# Patient Record
Sex: Male | Born: 1960 | Race: Black or African American | Hispanic: No | Marital: Married | Smoking: Current some day smoker
Health system: Southern US, Community
[De-identification: ages and names within clinical notes are randomized; demographics above are authoritative.]

## PROBLEM LIST (undated history)

## (undated) DIAGNOSIS — G43909 Migraine, unspecified, not intractable, without status migrainosus: Secondary | ICD-10-CM

---

## 2018-04-23 ENCOUNTER — Emergency Department (INDEPENDENT_AMBULATORY_CARE_PROVIDER_SITE_OTHER)
Admission: EM | Admit: 2018-04-23 | Discharge: 2018-04-23 | Disposition: A | Discharge status: Home / Self Care | Payer: No Typology Code available for payment source | Source: Home / Self Care | Attending: Family Medicine | Admitting: Family Medicine

## 2018-04-23 ENCOUNTER — Encounter: Payer: Self-pay | Admitting: *Deleted

## 2018-04-23 ENCOUNTER — Emergency Department (INDEPENDENT_AMBULATORY_CARE_PROVIDER_SITE_OTHER): Payer: No Typology Code available for payment source

## 2018-04-23 ENCOUNTER — Other Ambulatory Visit: Payer: Self-pay

## 2018-04-23 DIAGNOSIS — M7711 Lateral epicondylitis, right elbow: Secondary | ICD-10-CM

## 2018-04-23 DIAGNOSIS — R079 Chest pain, unspecified: Secondary | ICD-10-CM

## 2018-04-23 DIAGNOSIS — R05 Cough: Secondary | ICD-10-CM | POA: Diagnosis not present

## 2018-04-23 DIAGNOSIS — J209 Acute bronchitis, unspecified: Secondary | ICD-10-CM

## 2018-04-23 HISTORY — DX: Migraine, unspecified, not intractable, without status migrainosus: G43.909

## 2018-04-23 MED ORDER — BENZONATATE 200 MG PO CAPS: ORAL_CAPSULE | ORAL | 0 refills | Status: AC

## 2018-04-23 MED ORDER — DOXYCYCLINE HYCLATE 100 MG PO CAPS: 100.0000 mg | ORAL_CAPSULE | Freq: Two times a day (BID) | ORAL | 0 refills | Status: AC

## 2018-04-23 MED ORDER — PREDNISONE 20 MG PO TABS: ORAL_TABLET | ORAL | 0 refills | Status: AC

## 2018-04-23 NOTE — ED Provider Notes (Signed)
[facility]    CSN: [id number] Arrival date & time: 04/23/18  1917     History   Chief Complaint Chief Complaint  Patient presents with  . Elbow Pain  . Cough    HPI Robert Melendez is a 58 y.o. male.   Patient presents with two complaints: 1)  He has had pain in his right lateral elbow for two weeks, worse with flexion and extension of his right wrist.  He recalls no injury.  His job involves repetitive movement. 2)  He complains of one week history of non-productive cough with initial left posterior pleuritic pain (now resolved).  He has had mild nasal congestion but no sore throat.  He denies shortness of breath and wheezing.  He has developed chills during the past two days.  His cough is worse at night. He smokes several cigarettes daily.  The history is provided by the patient and the spouse.    Past Medical History:  Diagnosis Date  . Migraine     There are no active problems to display for this patient.   History reviewed. No pertinent surgical history.     Home Medications    Prior to Admission medications   Medication Sig Start Date End Date Taking? Authorizing Provider  citalopram (CELEXA) 20 MG tablet Take 20 mg by mouth daily.   Yes [provider]  TOPIRAMATE PO Take by mouth.   Yes [provider]  benzonatate (TESSALON) 200 MG capsule Take one cap by mouth at bedtime as needed for cough.  May repeat in 4 to 6 hours 04/23/18   Lattie Haw, MD  doxycycline (VIBRAMYCIN) 100 MG capsule Take 1 capsule (100 mg total) by mouth 2 (two) times daily. Take with food. 04/23/18   Lattie Haw, MD  predniSONE (DELTASONE) 20 MG tablet Take one tab by mouth twice daily for 5 days, then one daily for 3 days. Take with food. 04/23/18   Lattie Haw, MD    Family History Family History  Problem Relation Age of Onset  . Hypertension Mother     Social History Social History   Tobacco Use  . Smoking status: Current Some Day  Smoker  . Smokeless tobacco: Never Used  Substance Use Topics  . Alcohol use: Yes  . Drug use: Never     Allergies   Penicillins   Review of Systems Review of Systems No sore throat + cough + pleuritic pain left scapular area, now resolved. No wheezing + nasal congestion No post-nasal drainage No sinus pain/pressure No itchy/red eyes No earache No hemoptysis No SOB No fever, + chills/sweats No nausea No vomiting No abdominal pain No diarrhea No urinary symptoms No skin rash + fatigue No myalgias No headache + right lateral elbow pain Used OTC meds without relief   Physical Exam Triage Vital Signs ED Triage Vitals [04/23/18 1937]  Enc Vitals Group     BP 135/84     Pulse Rate 81     Resp 16     Temp      Temp src      SpO2 97 %     Weight 217 lb (98.4 kg)     Height 6\' 1"  (1.854 m)     Head Circumference      Peak Flow      Pain Score 5     Pain Loc      Pain Edu?      Excl. in GC?  No data found.  Updated Vital Signs BP 135/84 (BP Location: Left Arm)   Pulse 81   Resp 16   Ht 6\' 1"  (1.854 m)   Wt 98.4 kg   SpO2 97%   BMI 28.63 kg/m   Visual Acuity Right Eye Distance:   Left Eye Distance:   Bilateral Distance:    Right Eye Near:   Left Eye Near:    Bilateral Near:     Physical Exam Musculoskeletal:     Right elbow: He exhibits decreased range of motion. He exhibits no swelling, no effusion, no deformity and no laceration. Tenderness found. Lateral epicondyle tenderness noted.       Arms:     Comments: There is distinct tenderness over the right lateral epicondyle.  Palpation there during resisted dorsiflexion and supination of the wrist recreates his pain.     Nursing notes and Vital Signs reviewed. Appearance:  Patient appears stated age, and in no acute distress Eyes:  Pupils are equal, round, and reactive to light and accomodation.  Extraocular movement is intact.  Conjunctivae are not inflamed  Ears:  Canals normal.   Tympanic membranes normal.  Nose:  Mildly congested turbinates.  No sinus tenderness.  Pharynx:  Normal Neck:  Supple. No adenopathy   Lungs:  Clear to auscultation.  Breath sounds are equal.  Moving air well. Heart:  Regular rate and rhythm without murmurs, rubs, or gallops.  Abdomen:  Nontender without masses or hepatosplenomegaly.  Bowel sounds are present.  No CVA or flank tenderness.  Extremities:  No edema.  See elbow exam above. Skin:  No rash present.    UC Treatments / Results  Labs (all labs ordered are listed, but only abnormal results are displayed) Labs Reviewed - No data to display  EKG None  Radiology Dg Chest 2 View  Result Date: 04/23/2018 CLINICAL DATA:  Cough and left-sided chest pain for 1 week. Some day smoker. EXAM: CHEST - 2 VIEW COMPARISON:  None. FINDINGS: The cardiomediastinal contours are normal. The lungs are clear. Pulmonary vasculature is normal. No consolidation, pleural effusion, or pneumothorax. No acute osseous abnormalities are seen. Degenerative change in the spine. IMPRESSION: No acute chest findings. Electronically Signed   By: Narda Rutherford M.D.   On: 04/23/2018 20:16    Procedures Procedures (including critical care time)  Medications Ordered in UC Medications - No data to display  Initial Impression / Assessment and Plan / UC Course  I have reviewed the triage vital signs and the nursing notes.  Pertinent labs & imaging results that were available during my care of the patient were reviewed by me and considered in my medical decision making (see chart for details).    Tennis elbow brace applied to right forearm below elbow.  Patient has been advised in the past to minimize NSAID's; will begin prednisone burst/taper. Followup with Dr. Rodney Langton or Dr. Clementeen [facility] ([facility]) if not improving about three weeks.  Begin doxycycline.  Prescription written for Benzonatate [facility]) to take at bedtime for night-time  cough.  Followup with Family Doctor if cough and congestion not improved one week.    Final Clinical Impressions(s) / UC Diagnoses   Final diagnoses:  Lateral epicondylitis of right elbow  Acute bronchitis, unspecified organism     Discharge Instructions     Apply ice pack to right elbow for 20 to 30 minutes, 3 to 4 times daily  Continue until pain and swelling decrease. Wear elbow brace during the day.  Begin range of motion and stretching exercises as tolerated.  Take plain guaifenesin (1200mg  extended release tabs such as Mucinex) twice daily, with plenty of water, for cough and congestion.  Get adequate rest.   May take Delsym Cough Suppressant with Tessalon (benzonatate), if needed, at bedtime for nighttime cough.  Stop all antihistamines for now, and other non-prescription cough/cold preparations.      ED Prescriptions    Medication Sig Dispense Auth. Provider   predniSONE (DELTASONE) 20 MG tablet Take one tab by mouth twice daily for 5 days, then one daily for 3 days. Take with food. 13 tablet Lattie HawBeese, Buell Parcel A, MD   doxycycline (VIBRAMYCIN) 100 MG capsule Take 1 capsule (100 mg total) by mouth 2 (two) times daily. Take with food. 14 capsule Lattie HawBeese, Terrace Chiem A, MD   benzonatate (TESSALON) 200 MG capsule Take one cap by mouth at bedtime as needed for cough.  May repeat in 4 to 6 hours 15 capsule Cathren HarshBeese, Tera MaterStephen A, MD         Lattie HawBeese, Keagan Brislin A, MD 04/23/18 2041

## 2018-04-23 NOTE — Discharge Instructions (Addendum)
Apply ice pack to right elbow for 20 to 30 minutes, 3 to 4 times daily  Continue until pain and swelling decrease. Wear elbow brace during the day.  Begin range of motion and stretching exercises as tolerated.  Take plain guaifenesin (1200mg  extended release tabs such as Mucinex) twice daily, with plenty of water, for cough and congestion.  Get adequate rest.   May take Delsym Cough Suppressant with Tessalon (benzonatate), if needed, at bedtime for nighttime cough.  Stop all antihistamines for now, and other non-prescription cough/cold preparations.

## 2018-04-23 NOTE — ED Triage Notes (Signed)
Patient c/o right elbow pain without known injury x 1 month. Also c/o dry cough x 2 weeks.

## 2019-11-23 IMAGING — DX DG CHEST 2V
2 series · 2 of 2 positions shown · non-contrast
Comparison: None.

CLINICAL DATA: Cough and left-sided chest pain for 1 week. Some day
smoker.

EXAM:
CHEST - 2 VIEW

[chest pa]
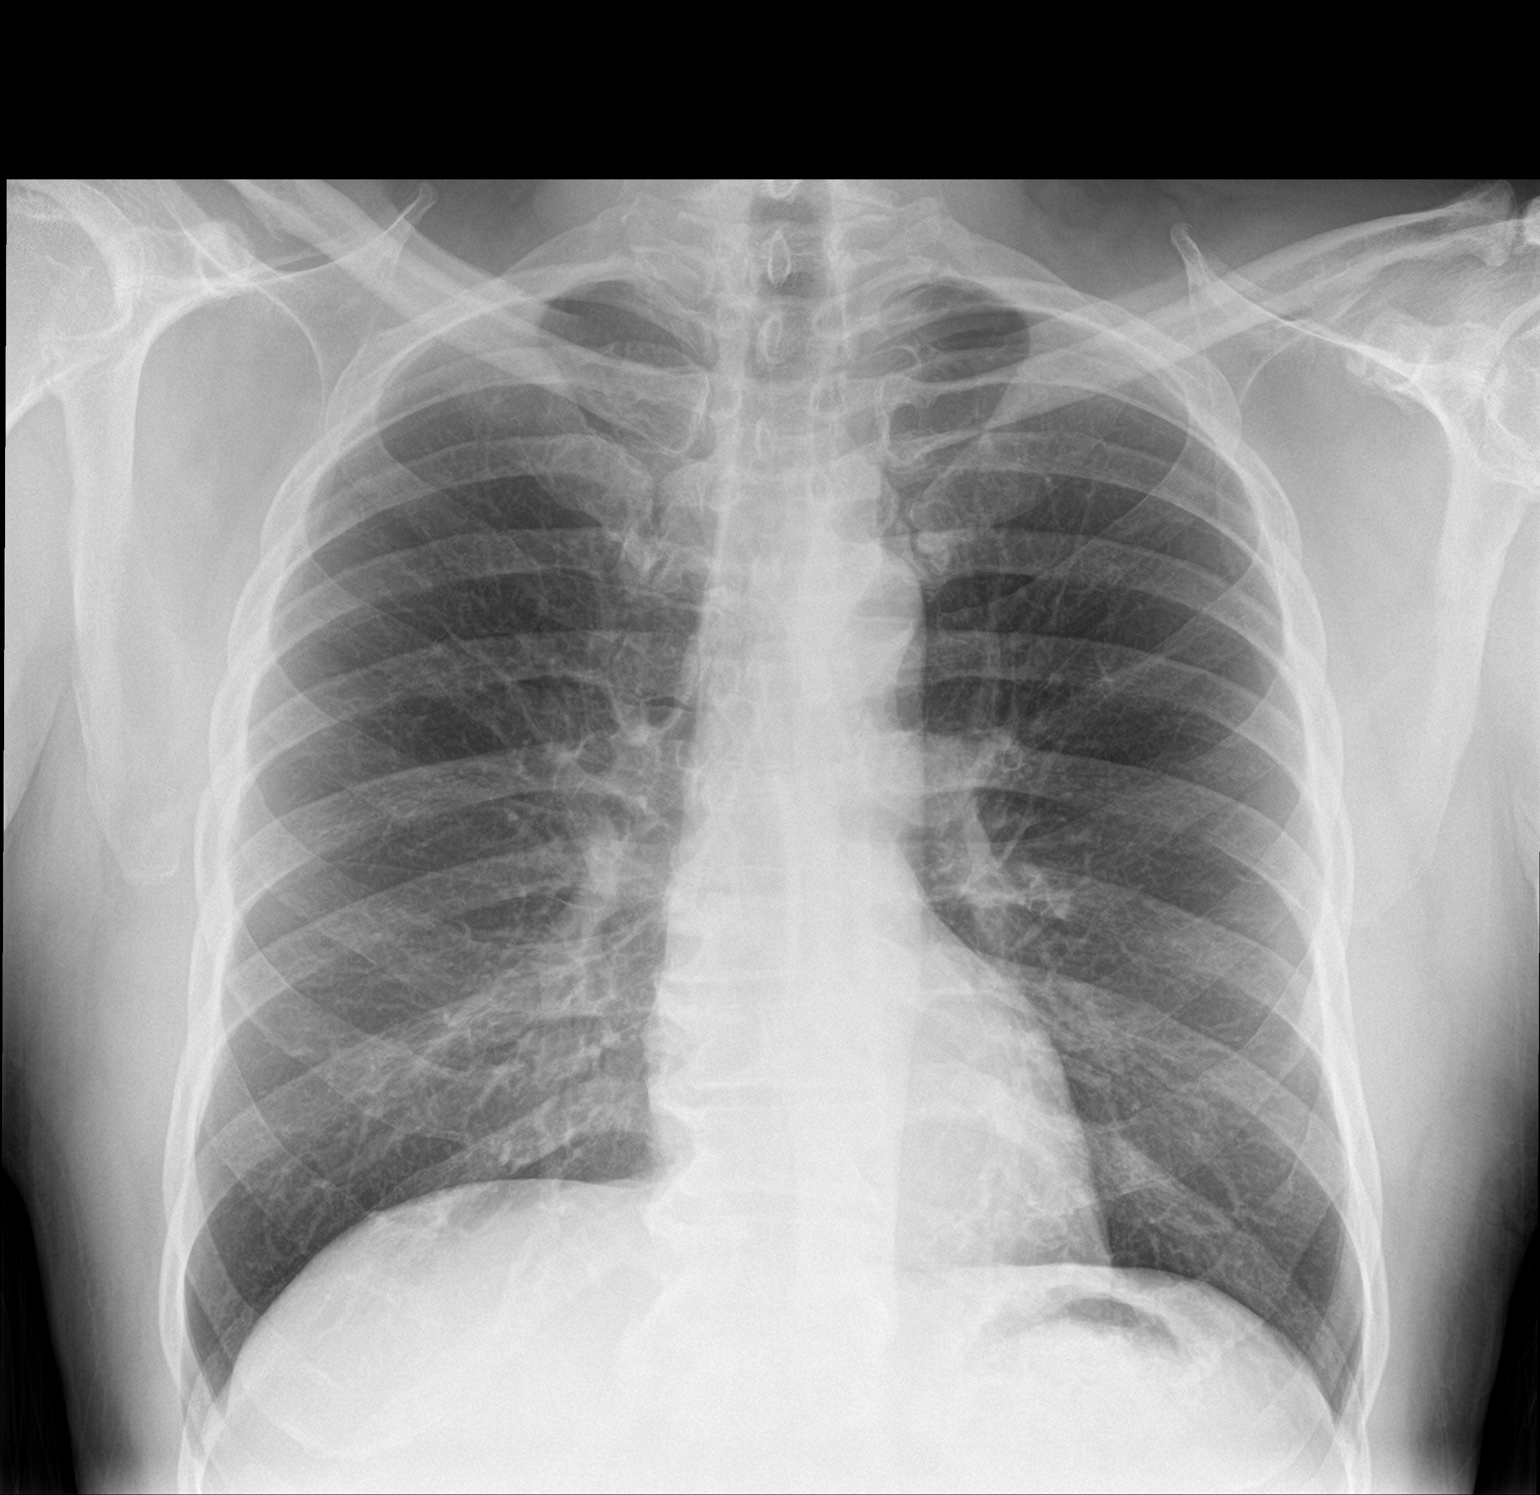

[chest lat]
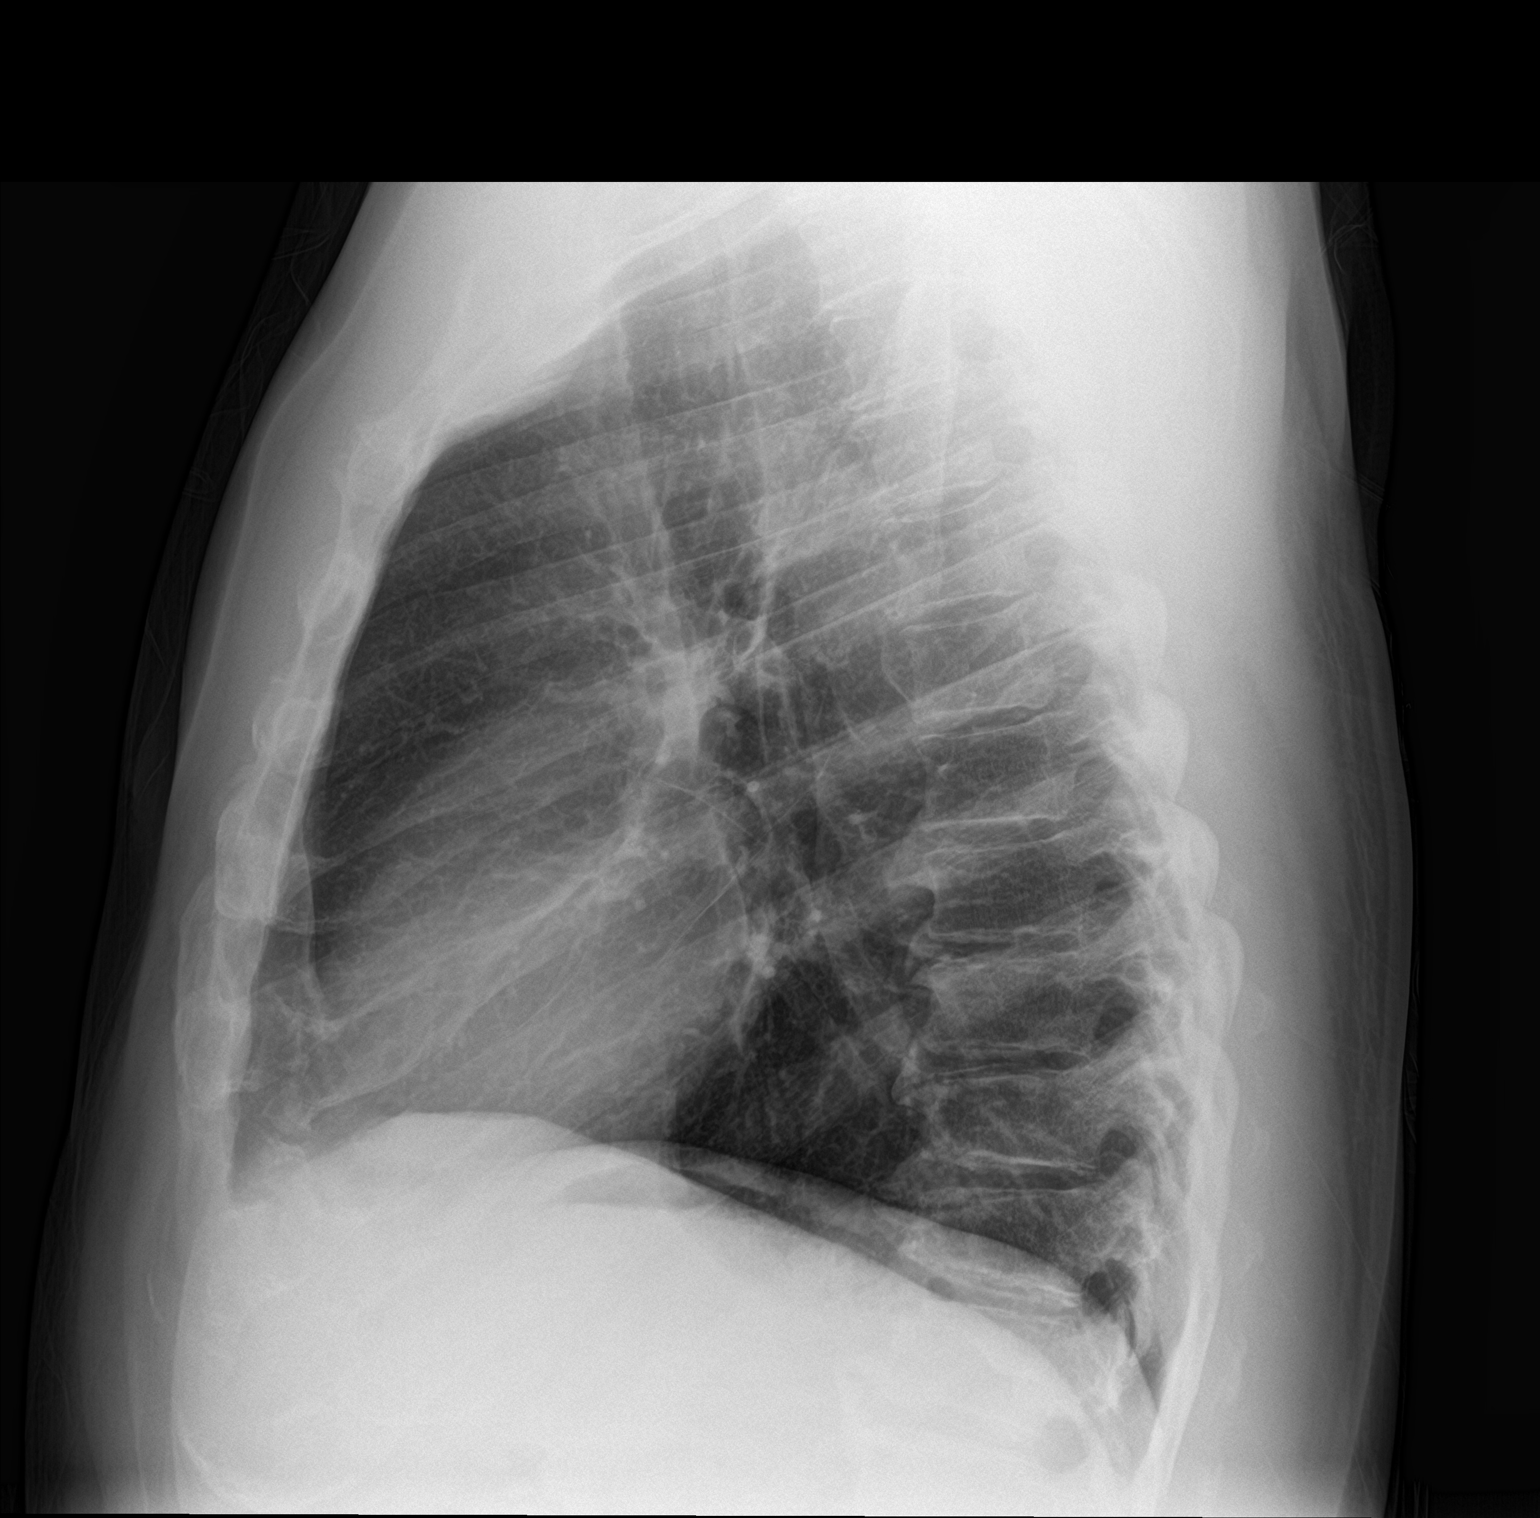

[2 of 2 positions shown; findings below may reference images not displayed]

FINDINGS: The cardiomediastinal contours are normal. The lungs are clear.
Pulmonary vasculature is normal. No consolidation, pleural effusion,
or pneumothorax. No acute osseous abnormalities are seen.
Degenerative change in the spine.
IMPRESSION: No acute chest findings.
